# Patient Record
Sex: Female | Born: 1960 | Race: Black or African American | Hispanic: No | Marital: Married | Smoking: Never smoker
Health system: Southern US, Community
[De-identification: ages and names within clinical notes are randomized; demographics above are authoritative.]

## PROBLEM LIST (undated history)

## (undated) DIAGNOSIS — D219 Benign neoplasm of connective and other soft tissue, unspecified: Secondary | ICD-10-CM

## (undated) DIAGNOSIS — J302 Other seasonal allergic rhinitis: Secondary | ICD-10-CM

## (undated) HISTORY — DX: Other seasonal allergic rhinitis: J30.2

## (undated) HISTORY — DX: Benign neoplasm of connective and other soft tissue, unspecified: D21.9

---

## 2008-08-22 ENCOUNTER — Encounter: Admission: RE | Admit: 2008-08-22 | Discharge: 2008-08-22 | Payer: Self-pay | Admitting: Obstetrics

## 2009-12-09 ENCOUNTER — Emergency Department (HOSPITAL_COMMUNITY): Admission: EM | Admit: 2009-12-09 | Discharge: 2009-12-09 | Payer: Self-pay | Admitting: Emergency Medicine

## 2010-09-27 ENCOUNTER — Emergency Department (HOSPITAL_COMMUNITY): Admission: EM | Admit: 2010-09-27 | Discharge: 2010-09-27 | Payer: Self-pay | Admitting: Emergency Medicine

## 2011-02-10 LAB — DIFFERENTIAL
Basophils Absolute: 0.1 10*3/uL (ref 0.0–0.1)
Eosinophils Absolute: 0.5 10*3/uL (ref 0.0–0.7)
Lymphocytes Relative: 18 % (ref 12–46)
Lymphs Abs: 1.1 10*3/uL (ref 0.7–4.0)
Neutrophils Relative %: 68 % (ref 43–77)

## 2011-02-10 LAB — CBC
HCT: 28.5 % — ABNORMAL LOW (ref 36.0–46.0)
Hemoglobin: 9 g/dL — ABNORMAL LOW (ref 12.0–15.0)
MCH: 24 pg — ABNORMAL LOW (ref 26.0–34.0)
MCHC: 31.7 g/dL (ref 30.0–36.0)
MCV: 75.8 fL — ABNORMAL LOW (ref 78.0–100.0)
RBC: 3.76 MIL/uL — ABNORMAL LOW (ref 3.87–5.11)

## 2011-02-10 LAB — GC/CHLAMYDIA PROBE AMP, GENITAL
Chlamydia, DNA Probe: NEGATIVE
GC Probe Amp, Genital: NEGATIVE

## 2011-02-10 LAB — COMPREHENSIVE METABOLIC PANEL
CO2: 25 mEq/L (ref 19–32)
Calcium: 9.4 mg/dL (ref 8.4–10.5)
Chloride: 106 mEq/L (ref 96–112)
Creatinine, Ser: 0.69 mg/dL (ref 0.4–1.2)
GFR calc non Af Amer: >60 mL/min (ref >60)
Glucose, Bld: 107 mg/dL — ABNORMAL HIGH (ref 70–99)
Total Bilirubin: 0.5 mg/dL (ref 0.3–1.2)

## 2011-02-10 LAB — URINALYSIS, ROUTINE W REFLEX MICROSCOPIC
Bilirubin Urine: NEGATIVE
Hgb urine dipstick: NEGATIVE
Ketones, ur: NEGATIVE mg/dL
Nitrite: POSITIVE — AB
pH: 6 (ref 5.0–8.0)

## 2011-02-10 LAB — URINE MICROSCOPIC-ADD ON

## 2011-02-10 LAB — LIPASE, BLOOD: Lipase: 37 U/L (ref 11–59)

## 2011-02-10 LAB — WET PREP, GENITAL: Yeast Wet Prep HPF POC: NONE SEEN

## 2011-02-24 ENCOUNTER — Encounter: Payer: Self-pay | Admitting: Obstetrics & Gynecology

## 2011-10-13 ENCOUNTER — Encounter: Payer: Self-pay | Admitting: Gynecology

## 2011-10-13 ENCOUNTER — Ambulatory Visit (INDEPENDENT_AMBULATORY_CARE_PROVIDER_SITE_OTHER): Payer: Managed Care, Other (non HMO) | Admitting: Gynecology

## 2011-10-13 ENCOUNTER — Other Ambulatory Visit (HOSPITAL_COMMUNITY)
Admission: RE | Admit: 2011-10-13 | Discharge: 2011-10-13 | Disposition: A | Discharge status: Home / Self Care | Payer: Managed Care, Other (non HMO) | Source: Ambulatory Visit | Attending: Gynecology | Admitting: Gynecology

## 2011-10-13 VITALS — BP 126/82 | Ht 63.5 in | Wt 137.0 lb

## 2011-10-13 DIAGNOSIS — E7800 Pure hypercholesterolemia, unspecified: Secondary | ICD-10-CM

## 2011-10-13 DIAGNOSIS — D649 Anemia, unspecified: Secondary | ICD-10-CM

## 2011-10-13 DIAGNOSIS — D259 Leiomyoma of uterus, unspecified: Secondary | ICD-10-CM

## 2011-10-13 DIAGNOSIS — R82998 Other abnormal findings in urine: Secondary | ICD-10-CM

## 2011-10-13 DIAGNOSIS — R739 Hyperglycemia, unspecified: Secondary | ICD-10-CM

## 2011-10-13 DIAGNOSIS — Z01419 Encounter for gynecological examination (general) (routine) without abnormal findings: Secondary | ICD-10-CM

## 2011-10-13 DIAGNOSIS — R7309 Other abnormal glucose: Secondary | ICD-10-CM

## 2011-10-13 DIAGNOSIS — R590 Localized enlarged lymph nodes: Secondary | ICD-10-CM

## 2011-10-13 DIAGNOSIS — R599 Enlarged lymph nodes, unspecified: Secondary | ICD-10-CM

## 2011-10-13 DIAGNOSIS — N924 Excessive bleeding in the premenopausal period: Secondary | ICD-10-CM

## 2011-10-13 DIAGNOSIS — R635 Abnormal weight gain: Secondary | ICD-10-CM

## 2011-10-13 MED ORDER — DICLOXACILLIN SODIUM 250 MG PO CAPS: 500.0000 mg | ORAL_CAPSULE | Freq: Two times a day (BID) | ORAL | Status: AC

## 2011-10-13 NOTE — Progress Notes (Signed)
Andrea Bishop Oct 11, 1961 [id number]   History:    50 y.o.  for annual exam who stated that her last gynecological examination was over 5 years ago. She states her Pap smears in the past been normal. She has been informed the past and she had a fibroid uterus. She is complaining sometimes feeling bloated and pressure and heavy periods for the first 3 days of her cycle and these were total 7-9 days. She's had history of chlamydia x2 in the past and history of HSV-2 as well. She's not using any form of contraception and sexually active. At times complaints of vaginal dryness. She had brought documentation for an emergency room visit approximately one year ago she was treated for urinary tract infection and because of abdominal pressure especially in the pelvic region she had an ultrasound which demonstrated at that point that her uterus measured 13.7 x 7.3 x 7.2 cm with a fundal fibroid measuring 6.0 x 5.5 x 6.3 cm. Also the ER visit there had demonstrated that she had anemia with a hemoglobin of 9.0 and she states that she sometimes takes iron supplementation and other times not. She was also complaining of a nodularity at that she noted on the right axillary region. And Past medical history,surgical history, family history and social history were all reviewed and documented in the EPIC chart.  Gynecologic History Patient's last menstrual period was 09/29/2011. Contraception: none Last Pap: Greater than 5 years ago. Results were: normal Last mammogram: Greater than 5 years ago. Results were: normal  Obstetric History OB History    Grav Para Term Preterm Abortions TAB SAB Ect Mult Living   3 1 1  2     1      # Outc Date GA Lbr Len/2nd Wgt Sex Del Anes PTL Lv   1 TRM     F CS  No Yes   2 ABT            3 ABT                ROS:  Was performed and pertinent positives and negatives are included in the history.  Exam: chaperone present  BP 126/82  Ht 5' 3.5" (1.613 m)  Wt 137 lb (62.143 kg)   BMI 23.89 kg/m2  LMP 09/29/2011  Body mass index is 23.89 kg/(m^2).  General appearance : Well developed well nourished female. No acute distress HEENT: Neck supple, trachea midline, no carotid bruits, no thyroidmegaly Lungs: Clear to auscultation, no rhonchi or wheezes, or rib retractions  Heart: Regular rate and rhythm, no murmurs or gallops Breast:Examined in sitting and supine position were symmetrical in appearance, no palpable masses or tenderness,  no skin retraction, no nipple inversion, no nipple discharge, no skin discoloration, no axillary or supraclavicular lymphadenopathy on the left but on the right there appears to be small nonerythematous inflamed axillary lymph node. Abdomen: no palpable masses or tenderness, no rebound or guarding Extremities: no edema or skin discoloration or tenderness  Pelvic:  Bartholin, Urethra, Skene Glands: Within normal limits             Vagina: No gross lesions or discharge  Cervix: No gross lesions or discharge  Uterus  14 week size, irregular shaped adnexa difficult to examine due to the size of the uterus.  Anus and perineum  normal   Rectovaginal  normal sphincter tone without palpated masses or tenderness             Hemoccult not done  Assessment/Plan:  50 y.o. female for annual exam with symptomatic leiomyomatous uteri. On examination today her uterus felt to be 14 week size. She has also a right reactive lymphadenopathy for which she'll be placed on dicloxacillin 500 mg twice a day for 10 day course. She will schedule her mammogram was is overdue. She'll return back to the office in 2 weeks for her breast exam as well as for ultrasound and to discuss further management. We discussed about consideration of a, hysterectomy with ovarian conservation for which literature formation was provided. We'll be checking her CBC, TSH, fasting lipid profile, random blood sugar, urinalysis along with her Pap smear. She'll return in 2 weeks for  followup and plan a course of management.  Ok Edwards MD, 1:31 PM 10/13/2011

## 2011-10-27 ENCOUNTER — Ambulatory Visit: Payer: Managed Care, Other (non HMO) | Admitting: Gynecology

## 2011-10-27 ENCOUNTER — Other Ambulatory Visit: Payer: Managed Care, Other (non HMO)

## 2011-12-14 ENCOUNTER — Ambulatory Visit: Payer: Managed Care, Other (non HMO) | Admitting: Gynecology

## 2012-01-19 ENCOUNTER — Emergency Department (INDEPENDENT_AMBULATORY_CARE_PROVIDER_SITE_OTHER)
Admission: EM | Admit: 2012-01-19 | Discharge: 2012-01-19 | Disposition: A | Discharge status: Home Health | Payer: Managed Care, Other (non HMO) | Source: Home / Self Care | Attending: Emergency Medicine | Admitting: Emergency Medicine

## 2012-01-19 ENCOUNTER — Encounter (HOSPITAL_COMMUNITY): Payer: Self-pay | Admitting: Emergency Medicine

## 2012-01-19 DIAGNOSIS — R1110 Vomiting, unspecified: Secondary | ICD-10-CM

## 2012-01-19 DIAGNOSIS — R197 Diarrhea, unspecified: Secondary | ICD-10-CM

## 2012-01-19 LAB — CBC
HCT: 36.9 % (ref 36.0–46.0)
Hemoglobin: 12 g/dL (ref 12.0–15.0)
MCHC: 32.5 g/dL (ref 30.0–36.0)
RDW: 14.3 % (ref 11.5–15.5)
WBC: 8.5 10*3/uL (ref 4.0–10.5)

## 2012-01-19 LAB — POCT I-STAT, CHEM 8
Creatinine, Ser: 0.9 mg/dL (ref 0.50–1.10)
Hemoglobin: 13.6 g/dL (ref 12.0–15.0)
Potassium: 4.2 mEq/L (ref 3.5–5.1)
Sodium: 142 mEq/L (ref 135–145)
TCO2: 27 mmol/L (ref 0–100)

## 2012-01-19 LAB — FERRITIN: Ferritin: 24 ng/mL (ref 10–291)

## 2012-01-19 LAB — OCCULT BLOOD, POC DEVICE: Fecal Occult Bld: NEGATIVE

## 2012-01-19 MED ORDER — DIPHENOXYLATE-ATROPINE 2.5-0.025 MG PO TABS: 1.0000 | ORAL_TABLET | Freq: Four times a day (QID) | ORAL | Status: AC | PRN

## 2012-01-19 MED ORDER — ONDANSETRON HCL 4 MG PO TABS: 4.0000 mg | ORAL_TABLET | Freq: Three times a day (TID) | ORAL | Status: AC | PRN

## 2012-01-19 MED ORDER — ONDANSETRON 4 MG PO TBDP
8.0000 mg | ORAL_TABLET | Freq: Once | ORAL | Status: AC
  Administered 2012-01-19: 8 mg via ORAL

## 2012-01-19 MED ORDER — ONDANSETRON 4 MG PO TBDP
ORAL_TABLET | ORAL | Status: AC
  Filled 2012-01-19: qty 1

## 2012-01-19 NOTE — ED Provider Notes (Signed)
History     CSN: [id number]  Arrival date & time 01/19/12  0801   First MD Initiated Contact with Patient 01/19/12 0813      Chief Complaint  Patient presents with  . Emesis    (Consider location/radiation/quality/duration/timing/severity/associated sxs/prior treatment) HPI Comments: Patient presented today to urgent care complaining of sudden onset of vomiting and diarrhea started today after about 3 or 4 episodes she didn't notice that her vomit was straining the water red also complaining of feeling tired.  I elicited all the questions and light after reviewing her previous chart is noticed that she has been diagnosed with anemia and probably gynecological reasons. She has been unable to followup with her gynecologist as discussion about a potential hysterectomy for funduscopic fibroma has been proposed to her.  Patient describes some mild discomfort in her epigastric area, no fevers, no diarrhea does.  Patient is a 51 y.o. female presenting with vomiting. The history is provided by the patient. No language interpreter was used.  Emesis  This is a new problem. The current episode started 12 to 24 hours ago. The problem has been gradually improving. There has been no fever. Associated symptoms include diarrhea. Pertinent negatives include no chills, no cough, no fever, no headaches and no URI.    Past Medical History  Diagnosis Date  . Fibroid   . Seasonal allergies     Past Surgical History  Procedure Date  . Cesarean section     Family History  Problem Relation Age of Onset  . Diabetes Mother   . Hypertension Mother   . Cancer Maternal Grandmother     CIRRHOSIS OF LIVER    History  Substance Use Topics  . Smoking status: Never Smoker   . Smokeless tobacco: Not on file  . Alcohol Use: No    OB History    Grav Para Term Preterm Abortions TAB SAB Ect Mult Living   3 1 1  2     1       Review of Systems  Constitutional: Positive for fatigue. Negative for  fever, chills and diaphoresis.  Respiratory: Negative for cough.   Cardiovascular: Negative for chest pain.  Gastrointestinal: Positive for vomiting and diarrhea.  Neurological: Negative for headaches.  Hematological: Negative for adenopathy.    Allergies  Review of patient's allergies indicates no known allergies.  Home Medications   Current Outpatient Rx  Name Route Sig Dispense Refill  . BEE POLLEN 1000 MG PO TABS Oral Take by mouth.      Marland Kitchen VITAMIN B 12 PO Oral Take by mouth.      Marland Kitchen DIPHENOXYLATE-ATROPINE 2.5-0.025 MG PO TABS Oral Take 1 tablet by mouth 4 (four) times daily as needed for diarrhea or loose stools. 15 tablet 0  . OMEGA-3 FATTY ACIDS 1000 MG PO CAPS Oral Take 2 g by mouth daily.      Marland Kitchen ONDANSETRON HCL 4 MG PO TABS Oral Take 1 tablet (4 mg total) by mouth every 8 (eight) hours as needed for nausea. 20 tablet 0    BP 133/80  Pulse 89  Temp(Src) 97.3 F (36.3 C) (Oral)  Resp 17  SpO2 100%  LMP 01/12/2012  Physical Exam  Nursing note and vitals reviewed. Constitutional: She appears well-nourished. No distress.  HENT:  Head: Normocephalic.  Eyes: Conjunctivae are normal. Right eye exhibits no discharge. Left eye exhibits no discharge.  Neck: Neck supple.  Cardiovascular: Normal rate.   Pulmonary/Chest: Effort normal and breath sounds normal. No respiratory distress.  Abdominal: Soft. Normal appearance. She exhibits no shifting dullness and no distension. There is tenderness in the epigastric area. There is no rebound, no guarding, no CVA tenderness, no tenderness at McBurney's point and negative Murphy's sign.  Lymphadenopathy:    She has no cervical adenopathy.  Skin: Skin is warm.    ED Course  Procedures (including critical care time)  Labs Reviewed  POCT I-STAT, CHEM 8 - Abnormal; Notable for the following:    Glucose, Bld 105 (*)    All other components within normal limits  CBC  FERRITIN  OCCULT BLOOD, POC DEVICE   No results found.   1.  Vomiting   2. Diarrhea       MDM  Patient looks comfortable on exam with sudden onset of gastrointestinal symptoms which included vomiting and diarrhea one of her episodes notice some redness in room sustaining a told water looked clear.        Jimmie Molly, MD 01/19/12 [phone]

## 2012-01-19 NOTE — ED Notes (Signed)
Pt having emesis and diarrhea that started today. She states there was some redness in her emesis that may have been blood. Pt also complain of fatigue recently.

## 2012-01-19 NOTE — Discharge Instructions (Signed)
As discussed during your exam if pain on your lower abdomen worsens or localizes to the right side or the left side of your abdomen you should go to the emergency department for further evaluation. Take prescribed medicines and take dietary precautions as discussed with the next 2 days.    Diarrhea Infections caused by germs (bacterial) or a virus commonly cause diarrhea. Your caregiver has determined that with time, rest and fluids, the diarrhea should improve. In general, eat normally while drinking more water than usual. Although water may prevent dehydration, it does not contain salt and minerals (electrolytes). Broths, weak tea without caffeine and oral rehydration solutions (ORS) replace fluids and electrolytes. Small amounts of fluids should be taken frequently. Large amounts at one time may not be tolerated. Plain water may be harmful in infants and the elderly. Oral rehydrating solutions (ORS) are available at pharmacies and grocery stores. ORS replace water and important electrolytes in proper proportions. Sports drinks are not as effective as ORS and may be harmful due to sugars worsening diarrhea.  ORS is especially recommended for use in children with diarrhea. As a general guideline for children, replace any new fluid losses from diarrhea and/or vomiting with ORS as follows:   If your child weighs 22 pounds or under (10 kg or less), give 60-120 mL ( -  cup or 2 - 4 ounces) of ORS for each episode of diarrheal stool or vomiting episode.   If your child weighs more than 22 pounds (more than 10 kgs), give 120-240 mL ( - 1 cup or 4 - 8 ounces) of ORS for each diarrheal stool or episode of vomiting.   While correcting for dehydration, children should eat normally. However, foods high in sugar should be avoided because this may worsen diarrhea. Large amounts of carbonated soft drinks, juice, gelatin desserts and other highly sugared drinks should be avoided.   After correction of  dehydration, other liquids that are appealing to the child may be added. Children should drink small amounts of fluids frequently and fluids should be increased as tolerated. Children should drink enough fluids to keep urine clear or pale yellow.   Adults should eat normally while drinking more fluids than usual. Drink small amounts of fluids frequently and increase as tolerated. Drink enough fluids to keep urine clear or pale yellow. Broths, weak decaffeinated tea, lemon lime soft drinks (allowed to go flat) and ORS replace fluids and electrolytes.   Avoid:   Carbonated drinks.   Juice.   Extremely hot or cold fluids.   Caffeine drinks.   Fatty, greasy foods.   Alcohol.   Tobacco.   Too much intake of anything at one time.   Gelatin desserts.   Probiotics are active cultures of beneficial bacteria. They may lessen the amount and number of diarrheal stools in adults. Probiotics can be found in yogurt with active cultures and in supplements.   Wash hands well to avoid spreading bacteria and virus.   Anti-diarrheal medications are not recommended for infants and children.   Only take over-the-counter or prescription medicines for pain, discomfort or fever as directed by your caregiver. Do not give aspirin to children because it may cause Reye's Syndrome.   For adults, ask your caregiver if you should continue all prescribed and over-the-counter medicines.   If your caregiver has given you a follow-up appointment, it is very important to keep that appointment. Not keeping the appointment could result in a chronic or permanent injury, and disability. If there is  any problem keeping the appointment, you must call back to this facility for assistance.  SEEK IMMEDIATE MEDICAL CARE IF:   You or your child is unable to keep fluids down or other symptoms or problems become worse in spite of treatment.   Vomiting or diarrhea develops and becomes persistent.   There is vomiting of blood  or bile (green material).   There is blood in the stool or the stools are black and tarry.   There is no urine output in 6-8 hours or there is only a small amount of very dark urine.   Abdominal pain develops, increases or localizes.   You have a fever.   Your baby is older than 3 months with a rectal temperature of 102 F (38.9 C) or higher.   Your baby is 27 months old or younger with a rectal temperature of 100.4 F (38 C) or higher.   You or your child develops excessive weakness, dizziness, fainting or extreme thirst.   You or your child develops a rash, stiff neck, severe headache or become irritable or sleepy and difficult to awaken.  MAKE SURE YOU:   Understand these instructions.   Will watch your condition.   Will get help right away if you are not doing well or get worse.  Document Released: 11/05/2002 Document Revised: 07/28/2011 Document Reviewed: 09/22/2009 [facility] Patient Information 2012 [place], [place].

## 2012-05-28 IMAGING — US US PELVIS COMPLETE MODIFY
1 series · 13 of 25 positions shown · non-contrast
Comparison: None

CLINICAL DATA: Abdominal pain.  Left pelvic pain.  Vaginal
discharge.



[Series 1: us pelvis complete modify · 0.28mm/px · 13 of 47 slices shown]
[im 1/47]
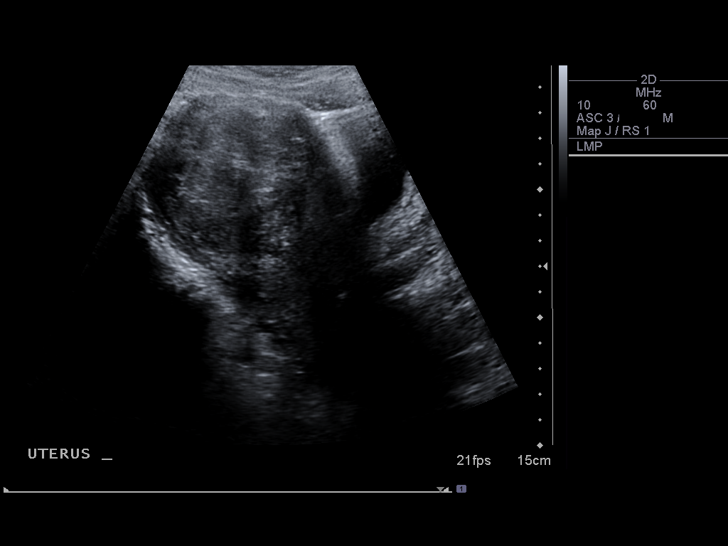
[im 4/47]
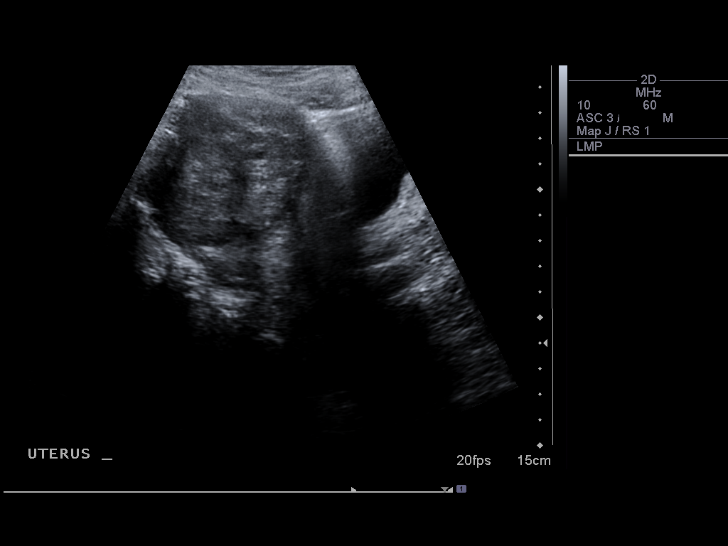
[im 8/47]
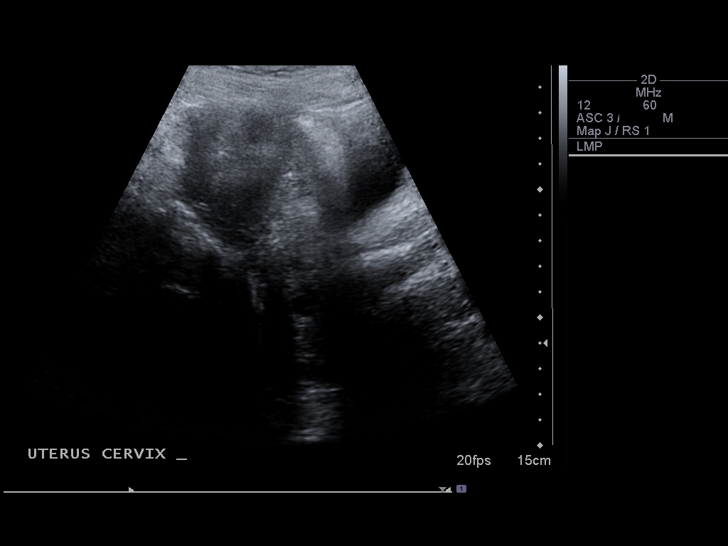
[im 12/47]
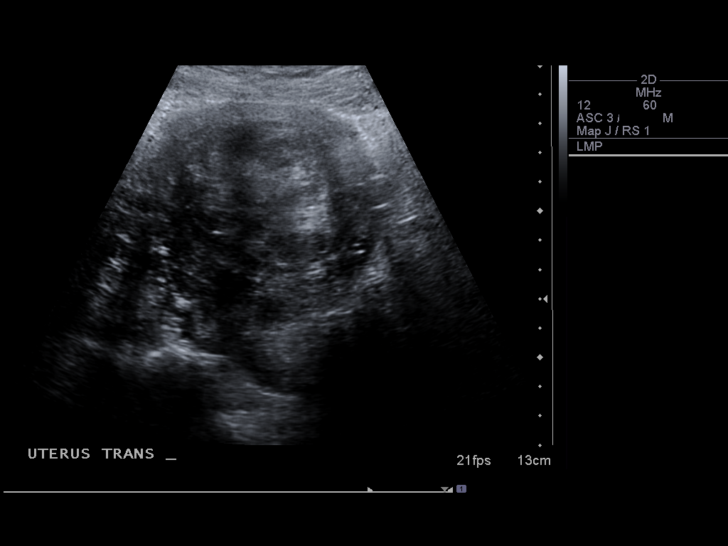
[im 16/47]
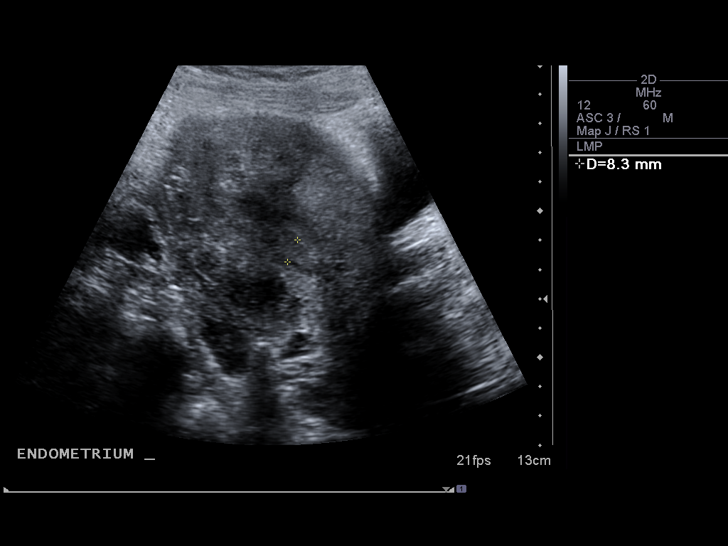
[im 20/47]
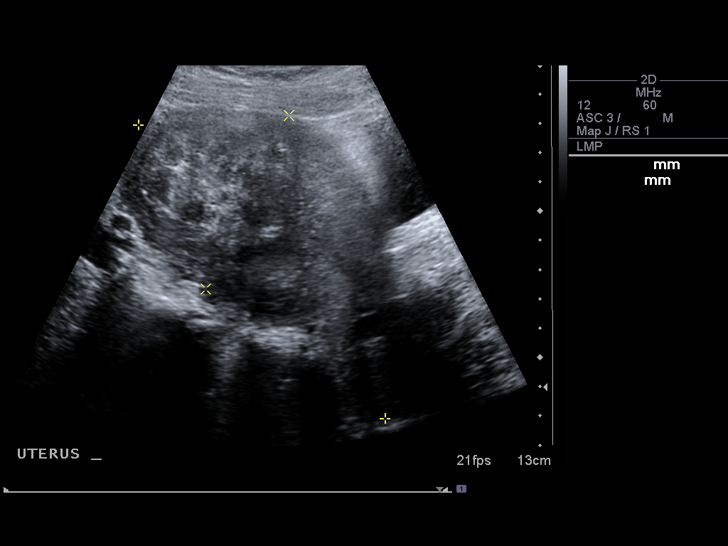
[im 24/47]
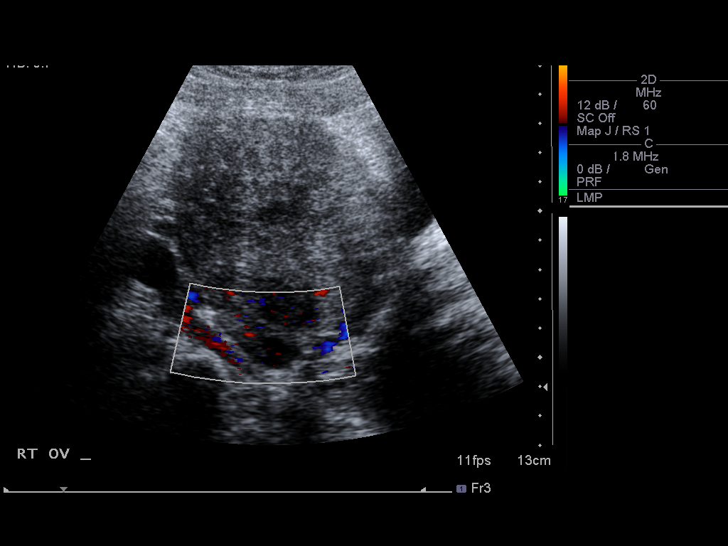
[im 27/47]
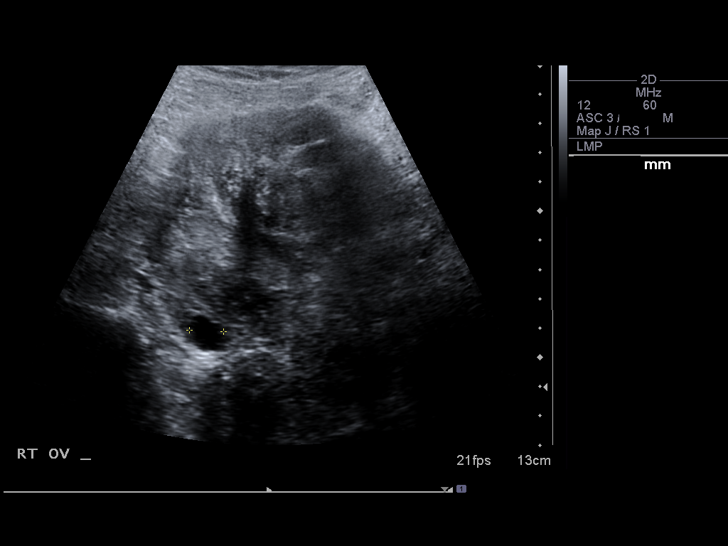
[im 31/47]
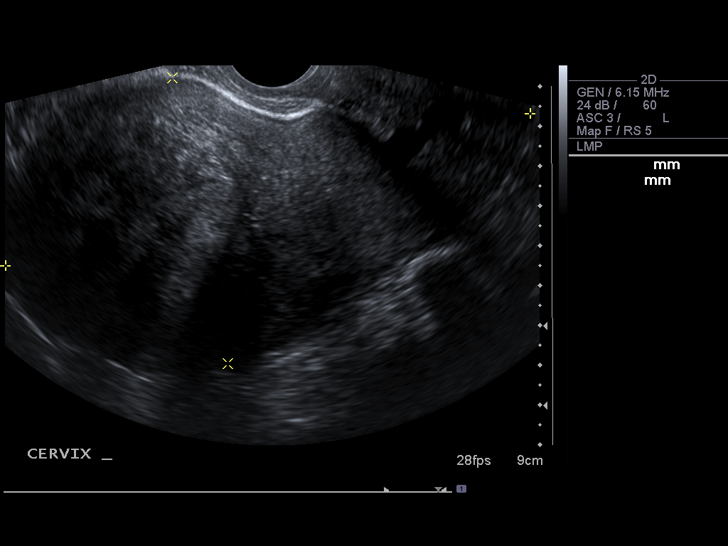
[im 35/47]
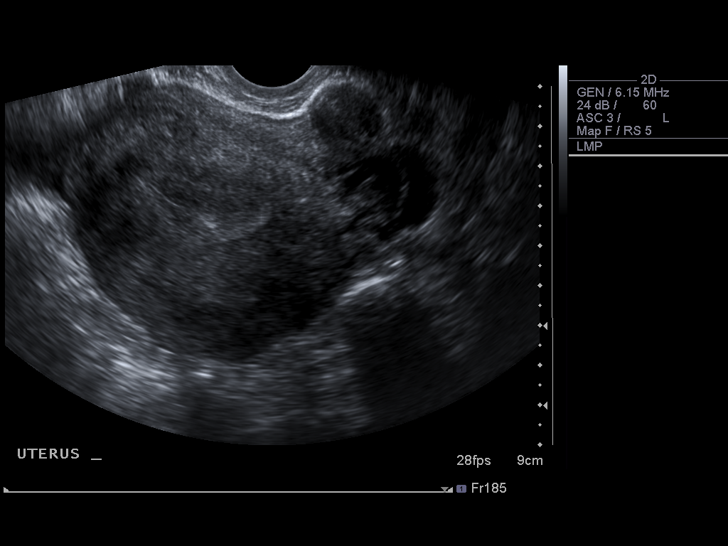
[im 39/47]
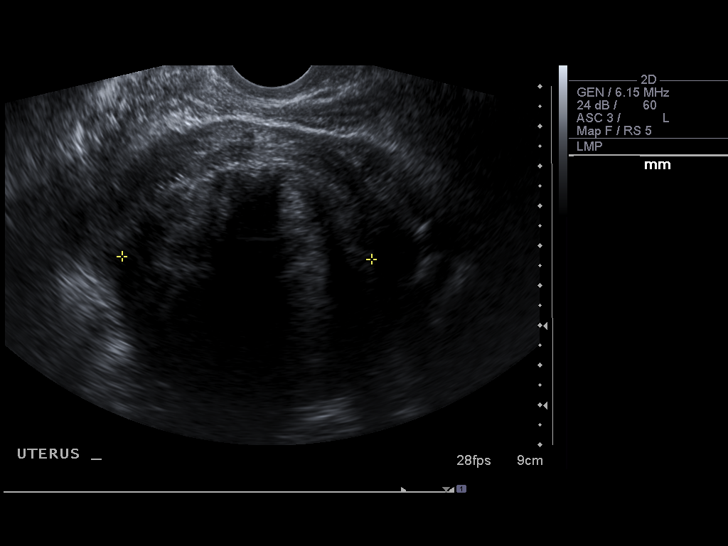
[im 43/47]
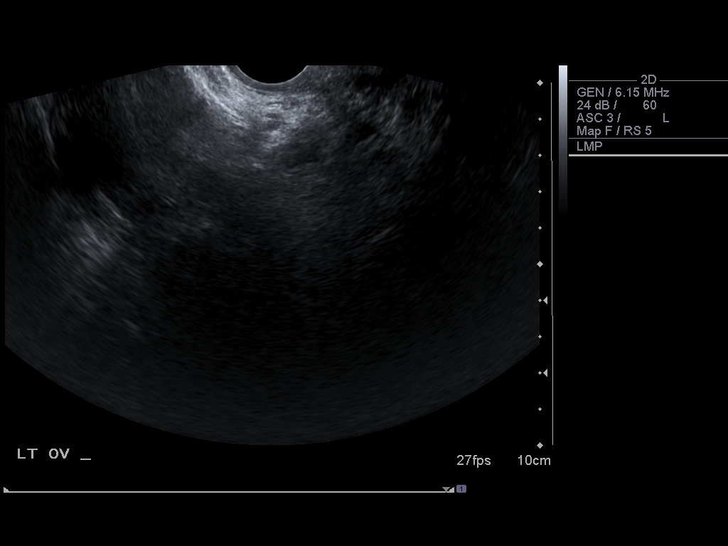
[im 47/47]
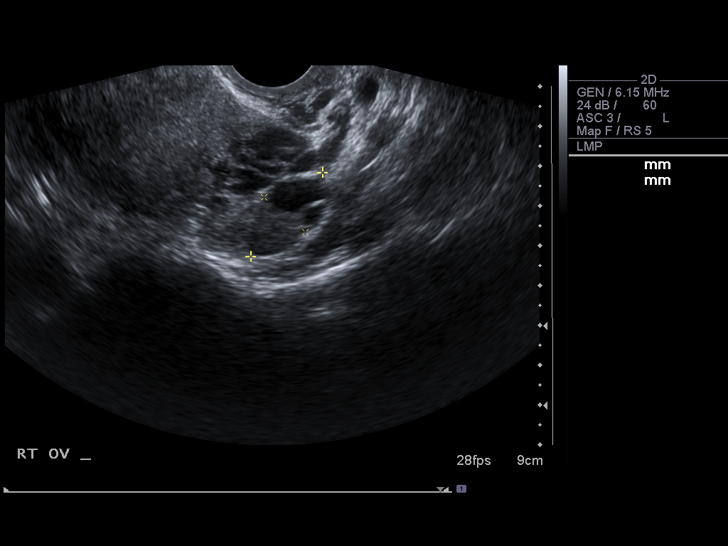

[13 of 25 positions shown; findings below may reference images not displayed]

FINDINGS: Uterus is enlarged measuring 13.7 x 7.3 x 7.2 cm.  There is
leiomyomatous change of the uterus with the largest fibroid being
at the fundus measuring 6.0 x 5.5 x 6.3 cm.

Endometrium is slightly thickened at 8.3 cm.  No endometrial fluid.

Right Ovary measures 3.2 x 1.9 x 2.2 cm.  This contains a 1.5 x
x 1.2 cm functional cyst.  Normal arterial and venous flow.

Left Ovary is not identified using either technique.

Other Findings:  No free fluid
IMPRESSION: Enlargement of the uterus due to leiomyomatous change.  Dominant 6
cm fibroid at the vertex of the fundus.

Unremarkable appearance of the right ovary.  Left ovary is not
identified using either technique.

No free fluid.

## 2013-02-20 ENCOUNTER — Other Ambulatory Visit: Payer: Self-pay | Admitting: Obstetrics and Gynecology

## 2013-02-20 DIAGNOSIS — N6320 Unspecified lump in the left breast, unspecified quadrant: Secondary | ICD-10-CM

## 2013-03-02 ENCOUNTER — Ambulatory Visit
Admission: RE | Admit: 2013-03-02 | Discharge: 2013-03-02 | Disposition: A | Discharge status: Home / Self Care | Payer: Managed Care, Other (non HMO) | Source: Ambulatory Visit | Attending: Obstetrics and Gynecology | Admitting: Obstetrics and Gynecology

## 2013-03-02 DIAGNOSIS — N6320 Unspecified lump in the left breast, unspecified quadrant: Secondary | ICD-10-CM

## 2013-12-24 ENCOUNTER — Emergency Department (HOSPITAL_BASED_OUTPATIENT_CLINIC_OR_DEPARTMENT_OTHER)
Admission: EM | Admit: 2013-12-24 | Discharge: 2013-12-24 | Disposition: A | Discharge status: Home / Self Care | Payer: Managed Care, Other (non HMO) | Attending: Emergency Medicine | Admitting: Emergency Medicine

## 2013-12-24 ENCOUNTER — Encounter (HOSPITAL_BASED_OUTPATIENT_CLINIC_OR_DEPARTMENT_OTHER): Payer: Self-pay | Admitting: Emergency Medicine

## 2013-12-24 DIAGNOSIS — M779 Enthesopathy, unspecified: Secondary | ICD-10-CM

## 2013-12-24 DIAGNOSIS — R252 Cramp and spasm: Secondary | ICD-10-CM | POA: Insufficient documentation

## 2013-12-24 DIAGNOSIS — Z8742 Personal history of other diseases of the female genital tract: Secondary | ICD-10-CM | POA: Insufficient documentation

## 2013-12-24 DIAGNOSIS — Z79899 Other long term (current) drug therapy: Secondary | ICD-10-CM | POA: Insufficient documentation

## 2013-12-24 DIAGNOSIS — Z8709 Personal history of other diseases of the respiratory system: Secondary | ICD-10-CM | POA: Insufficient documentation

## 2013-12-24 DIAGNOSIS — M659 Unspecified synovitis and tenosynovitis, unspecified site: Secondary | ICD-10-CM | POA: Insufficient documentation

## 2013-12-24 DIAGNOSIS — Z791 Long term (current) use of non-steroidal anti-inflammatories (NSAID): Secondary | ICD-10-CM | POA: Insufficient documentation

## 2013-12-24 LAB — BASIC METABOLIC PANEL
BUN: 7 mg/dL (ref 6–23)
CALCIUM: 10.1 mg/dL (ref 8.4–10.5)
CHLORIDE: 102 meq/L (ref 96–112)
CO2: 26 meq/L (ref 19–32)
CREATININE: 0.8 mg/dL (ref 0.50–1.10)
GFR calc Af Amer: >90 mL/min (ref >90)
GFR calc non Af Amer: 85 mL/min — ABNORMAL LOW (ref >90)
Glucose, Bld: 113 mg/dL — ABNORMAL HIGH (ref 70–99)
Potassium: 4.6 mEq/L (ref 3.7–5.3)
Sodium: 140 mEq/L (ref 137–147)

## 2013-12-24 MED ORDER — NAPROXEN 375 MG PO TABS: 375.0000 mg | ORAL_TABLET | Freq: Two times a day (BID) | ORAL | Status: DC

## 2013-12-24 MED ORDER — CYCLOBENZAPRINE HCL 10 MG PO TABS: 10.0000 mg | ORAL_TABLET | Freq: Two times a day (BID) | ORAL | Status: DC | PRN

## 2013-12-24 MED ORDER — OXYCODONE-ACETAMINOPHEN 5-325 MG PO TABS
2.0000 | ORAL_TABLET | Freq: Once | ORAL | Status: AC
  Administered 2013-12-24: 2 via ORAL
  Filled 2013-12-24: qty 2

## 2013-12-24 NOTE — Discharge Instructions (Signed)
Muscle Cramps and Spasms Muscle cramps and spasms occur when a muscle or muscles tighten and you have no control over this tightening (involuntary muscle contraction). They are a common problem and can develop in any muscle. The most common place is in the calf muscles of the leg. Both muscle cramps and muscle spasms are involuntary muscle contractions, but they also have differences:   Muscle cramps are sporadic and painful. They may last a few seconds to a quarter of an hour. Muscle cramps are often more forceful and last longer than muscle spasms.  Muscle spasms may or may not be painful. They may also last just a few seconds or much longer. CAUSES  It is uncommon for cramps or spasms to be due to a serious underlying problem. In many cases, the cause of cramps or spasms is unknown. Some common causes are:   Overexertion.   Overuse from repetitive motions (doing the same thing over and over).   Remaining in a certain position for a long period of time.   Improper preparation, form, or technique while performing a sport or activity.   Dehydration.   Injury.   Side effects of some medicines.   Abnormally low levels of the salts and ions in your blood (electrolytes), especially potassium and calcium. This could happen if you are taking water pills (diuretics) or you are pregnant.  Some underlying medical problems can make it more likely to develop cramps or spasms. These include, but are not limited to:   Diabetes.   Parkinson disease.   Hormone disorders, such as thyroid problems.   Alcohol abuse.   Diseases specific to muscles, joints, and bones.   Blood vessel disease where not enough blood is getting to the muscles.  HOME CARE INSTRUCTIONS   Stay well hydrated. Drink enough water and fluids to keep your urine clear or pale yellow.  It may be helpful to massage, stretch, and relax the affected muscle.  For tight or tense muscles, use a warm towel, heating  pad, or hot shower water directed to the affected area.  If you are sore or have pain after a cramp or spasm, applying ice to the affected area may relieve discomfort.  Put ice in a plastic bag.  Place a towel between your skin and the bag.  Leave the ice on for 15-20 minutes, 03-04 times a day.  Medicines used to treat a known cause of cramps or spasms may help reduce their frequency or severity. Only take over-the-counter or prescription medicines as directed by your caregiver. SEEK MEDICAL CARE IF:  Your cramps or spasms get more severe, more frequent, or do not improve over time.  MAKE SURE YOU:   Understand these instructions.  Will watch your condition.  Will get help right away if you are not doing well or get worse. Document Released: 05/07/2002 Document Revised: 03/12/2013 Document Reviewed: 11/01/2012 [facility] Patient Information 2014 [place], [place].  Tendinitis Tendinitis is swelling and inflammation of the tendons. Tendons are band-like tissues that connect muscle to bone. Tendinitis commonly occurs in the:   Shoulders (rotator cuff).  Heels (Achilles tendon).  Elbows (triceps tendon). CAUSES Tendinitis is usually caused by overusing the tendon, muscles, and joints involved. When the tissue surrounding a tendon (synovium) becomes inflamed, it is called tenosynovitis. Tendinitis commonly develops in people whose jobs require repetitive motions. SYMPTOMS  Pain.  Tenderness.  Mild swelling. DIAGNOSIS Tendinitis is usually diagnosed by physical exam. Your caregiver may also order X-rays or other imaging tests.  TREATMENT Your caregiver may recommend certain medicines or exercises for your treatment. HOME CARE INSTRUCTIONS   Use a sling or splint for as long as directed by your caregiver until the pain decreases.  Put ice on the injured area.  Put ice in a plastic bag.  Place a towel between your skin and the bag.  Leave the ice on for 15-20 minutes, 03-04  times a day.  Avoid using the limb while the tendon is painful. Perform gentle range of motion exercises only as directed by your caregiver. Stop exercises if pain or discomfort increase, unless directed otherwise by your caregiver.  Only take over-the-counter or prescription medicines for pain, discomfort, or fever as directed by your caregiver. SEEK MEDICAL CARE IF:   Your pain and swelling increase.  You develop new, unexplained symptoms, especially increased numbness in the hands. MAKE SURE YOU:   Understand these instructions.  Will watch your condition.  Will get help right away if you are not doing well or get worse. Document Released: 11/12/2000 Document Revised: 02/07/2012 Document Reviewed: 02/01/2011 [facility] Patient Information 2014 [place], [place].

## 2013-12-24 NOTE — ED Provider Notes (Signed)
CSN: [id number]     Arrival date & time 12/24/13  2585 History   First MD Initiated Contact with Patient 12/24/13 [phone]     Chief Complaint  Patient presents with  . Leg Pain   (Consider location/radiation/quality/duration/timing/severity/associated sxs/prior Treatment) HPI Comments: Patient reports intermittent cramping to her left thigh that started last night. She had one episode of week ago but it wasn't as severe and it went away on its own. She states the cramping last night was severe and kept her awake all night. She denies any back pain. She denies any radiation into her lower leg. She denies any numbness or weakness in the leg. The crampiness to her anterior left thigh. She denies any known injuries or overuse. She was working out at the gym doing leg lifts 2 days ago but states the episode week ago his prior to that. She also reports a month history of some pain in her right upper arm. It's worse with abduction of the arm. She denies any weakness of the room other than sometimes, she has to lift it with her other hand due to the pain. She denies any numbness in her hand. 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  Patient is a 53 y.o. female presenting with leg pain.  Leg Pain Associated symptoms: no back pain, no fever and no neck pain     Past Medical History  Diagnosis Date  . Fibroid   . Seasonal allergies    Past Surgical History  Procedure Laterality Date  . Cesarean section     Family History  Problem Relation Age of Onset  . Diabetes Mother   . Hypertension Mother   . Cancer Maternal Grandmother     CIRRHOSIS OF LIVER   History  Substance Use Topics  . Smoking status: Never Smoker   . Smokeless tobacco: Not on file  . Alcohol Use: No   OB History   Grav Para Term Preterm Abortions TAB SAB Ect Mult Living   3 1 1  2     1      Review of Systems  Constitutional: Negative for fever.  Gastrointestinal: Negative for nausea  and vomiting.  Musculoskeletal: Positive for arthralgias and myalgias. Negative for back pain, joint swelling and neck pain.  Skin: Negative for wound.  Neurological: Negative for weakness, numbness and headaches.    Allergies  Review of patient's allergies indicates no known allergies.  Home Medications   Current Outpatient Rx  Name  Route  Sig  Dispense  Refill  . Bee Pollen 1000 MG TABS   Oral   Take by mouth.           . Cyanocobalamin (VITAMIN B 12 PO)   Oral   Take by mouth.           . cyclobenzaprine (FLEXERIL) 10 MG tablet   Oral   Take 1 tablet (10 mg total) by mouth 2 (two) times daily as needed for muscle spasms.   20 tablet   0   . fish oil-omega-3 fatty acids 1000 MG capsule   Oral   Take 2 g by mouth daily.           . naproxen (NAPROSYN) 375 MG tablet   Oral   Take 1 tablet (375 mg total) by mouth 2 (two) times daily.   20 tablet   0    BP 132/81  Pulse 85  Temp(Src) 98.2 F (36.8 C) (Oral)  Resp 18  Ht 5\' 6"  (1.676 m)  Wt 145 lb (65.772 kg)  BMI 23.41 kg/m2  SpO2 100%  LMP 12/01/2013 Physical Exam  Constitutional: She is oriented to person, place, and time. She appears well-developed and well-nourished.  HENT:  Head: Normocephalic and atraumatic.  Neck: Normal range of motion. Neck supple.  Cardiovascular: Normal rate.   Pulmonary/Chest: Effort normal.  Musculoskeletal: She exhibits tenderness.  There is some tenderness over the quadriceps muscle on the left. There is no bony tenderness. There is no pain to the hip or the knee. There is no swelling of her leg. She has normal sensation in the foot. Normal pulses in the foot. Normal motor function in the leg. There is some tenderness along the triceps muscle in the right arm as well as the triceps tendon insertion. There is no bony tenderness to the shoulder or the elbow. She has normal sensation and motor function in the arm. Radial pulses are 2+ bilaterally.  Neurological: She is alert  and oriented to person, place, and time.  Skin: Skin is warm and dry.  Psychiatric: She has a normal mood and affect.    ED Course  Procedures (including critical care time) Labs Review Labs Reviewed  BASIC METABOLIC PANEL - Abnormal; Notable for the following:    Glucose, Bld 113 (*)    GFR calc non Af Amer 85 (*)    All other components within normal limits   Imaging Review No results found.  EKG Interpretation   None       MDM   1. Muscle cramp   2. Tendonitis    Patient presents with a one-day history of muscle cramps in her thigh. There's no posterior pain swelling or other suggestions of a DVT. There is no bony tenderness that would warrant x-rays at this point. There is new suggestions of neuropathy or radiation from her back. She also has tenderness in her right arm which is more suggestive of tendinitis. She was discharged home in good condition. She was given a prescription for Naprosyn and Flexeril. She was encouraged to follow with Dr. Barbaraann Barthel.    Malvin Johns, MD 12/24/13 [phone]

## 2013-12-24 NOTE — ED Notes (Signed)
Pt complains of cramping in left thigh that wont stop. Pt reports having episode about one week ago but last night was severe.  Pt also complains of right arm pain. Pt complains of not being able to pick up things with right arm but it is sore.

## 2014-04-24 ENCOUNTER — Other Ambulatory Visit: Payer: Self-pay | Admitting: Obstetrics and Gynecology

## 2014-04-24 DIAGNOSIS — N63 Unspecified lump in unspecified breast: Secondary | ICD-10-CM

## 2014-09-30 ENCOUNTER — Encounter (HOSPITAL_BASED_OUTPATIENT_CLINIC_OR_DEPARTMENT_OTHER): Payer: Self-pay | Admitting: Emergency Medicine

## 2015-07-18 ENCOUNTER — Other Ambulatory Visit: Payer: Self-pay

## 2015-07-18 ENCOUNTER — Other Ambulatory Visit (HOSPITAL_COMMUNITY)
Admission: RE | Admit: 2015-07-18 | Discharge: 2015-07-18 | Disposition: A | Discharge status: Home / Self Care | Payer: BLUE CROSS/BLUE SHIELD | Source: Ambulatory Visit | Attending: Family Medicine | Admitting: Family Medicine

## 2015-07-18 DIAGNOSIS — Z01419 Encounter for gynecological examination (general) (routine) without abnormal findings: Secondary | ICD-10-CM | POA: Diagnosis present

## 2015-07-22 LAB — CYTOLOGY - PAP

## 2015-11-26 ENCOUNTER — Other Ambulatory Visit: Payer: Self-pay | Admitting: Physician Assistant

## 2015-11-26 ENCOUNTER — Ambulatory Visit
Admission: RE | Admit: 2015-11-26 | Discharge: 2015-11-26 | Disposition: A | Discharge status: Home / Self Care | Payer: BLUE CROSS/BLUE SHIELD | Source: Ambulatory Visit | Attending: Physician Assistant | Admitting: Physician Assistant

## 2015-11-26 DIAGNOSIS — R1012 Left upper quadrant pain: Secondary | ICD-10-CM

## 2016-06-27 ENCOUNTER — Encounter (HOSPITAL_BASED_OUTPATIENT_CLINIC_OR_DEPARTMENT_OTHER): Payer: Self-pay | Admitting: *Deleted

## 2016-06-27 ENCOUNTER — Emergency Department (HOSPITAL_BASED_OUTPATIENT_CLINIC_OR_DEPARTMENT_OTHER)
Admission: EM | Admit: 2016-06-27 | Discharge: 2016-06-27 | Disposition: A | Discharge status: Home / Self Care | Payer: BLUE CROSS/BLUE SHIELD | Attending: Emergency Medicine | Admitting: Emergency Medicine

## 2016-06-27 ENCOUNTER — Emergency Department (HOSPITAL_BASED_OUTPATIENT_CLINIC_OR_DEPARTMENT_OTHER): Payer: BLUE CROSS/BLUE SHIELD

## 2016-06-27 DIAGNOSIS — R072 Precordial pain: Secondary | ICD-10-CM | POA: Insufficient documentation

## 2016-06-27 DIAGNOSIS — R079 Chest pain, unspecified: Secondary | ICD-10-CM

## 2016-06-27 LAB — BASIC METABOLIC PANEL
ANION GAP: 6 (ref 5–15)
BUN: 10 mg/dL (ref 6–20)
CALCIUM: 9.7 mg/dL (ref 8.9–10.3)
CHLORIDE: 103 mmol/L (ref 101–111)
CO2: 28 mmol/L (ref 22–32)
CREATININE: 0.74 mg/dL (ref 0.44–1.00)
GFR calc non Af Amer: >60 mL/min (ref >60)
Glucose, Bld: 87 mg/dL (ref 65–99)
Potassium: 4 mmol/L (ref 3.5–5.1)
SODIUM: 137 mmol/L (ref 135–145)

## 2016-06-27 LAB — CBC
HCT: 41.2 % (ref 36.0–46.0)
HEMOGLOBIN: 13.5 g/dL (ref 12.0–15.0)
MCH: 29.6 pg (ref 26.0–34.0)
MCHC: 32.8 g/dL (ref 30.0–36.0)
MCV: 90.4 fL (ref 78.0–100.0)
Platelets: 227 10*3/uL (ref 150–400)
RBC: 4.56 MIL/uL (ref 3.87–5.11)
RDW: 13.1 % (ref 11.5–15.5)
WBC: 6.9 10*3/uL (ref 4.0–10.5)

## 2016-06-27 LAB — TROPONIN I

## 2016-06-27 NOTE — ED Notes (Addendum)
PA in room with PT now. I asked pt to take her top off when he leaves so that I can come back after he is finished and place her on the monitor and obtain vital signs. Pt did not respond. Has arms crossed and seems agitated.

## 2016-06-27 NOTE — ED Triage Notes (Signed)
Pt reports intermittent substernal CP x 3 days.  Denies N/V/SOB.  Nonradiating.  nontender on palpation.  Denies increased pain with deep inspiration.

## 2016-06-27 NOTE — ED Notes (Signed)
Josh PA at bedside   

## 2016-06-27 NOTE — ED Provider Notes (Signed)
[facility] Provider Note   CSN: [id number] Arrival date & time: 06/27/16  1510  First Provider Contact:  First MD Initiated Contact with Patient 06/27/16 1654    By signing my name below, I, Andrea Bishop, attest that this documentation has been prepared under the direction and in the presence of [employer], PA-C. Electronically Signed: Randa Bishop, ED Scribe. 06/27/16. 5:02 PM.     History   Chief Complaint Chief Complaint  Patient presents with  . Chest Pain    HPI Andrea Bishop is a 55 y.o. female.  The history is provided by the patient. No language interpreter was used.  Chest Pain   Pertinent negatives include no abdominal pain, no back pain, no cough, no diaphoresis, no fever, no nausea, no palpitations, no shortness of breath and no vomiting.   HPI Comments: Andrea Bishop is a 55 y.o. female who presents to the Emergency Department complaining of sudden intermittent central CP onset 3 days prior. She describes the pain as a pressure on her chest that last for about 1 minute. She states that the pain is non radiating. She states that that the pain is worse in the morning and occurs every 15 - 20 minutes. Pt does report that she has started a new job where she does a lot of heavy lifting. Pt reports that about 1 month prior she did have intermittent sharp chest pains that resolved on its own. States she has tried prune juice with no relief. Denies SOB, leg swelling, diaphoresis, nausea, vomiting or palpitations. Denies DVT or PE's. Denies recent long travel or surgery. Denies hormone use. Denies tobacco use. Pt reports HX of family early CAD in her father.   Past Medical History:  Diagnosis Date  . Fibroid   . Seasonal allergies     Patient Active Problem List   Diagnosis Date Noted  . Fibroid uterus 10/13/2011    Past Surgical History:  Procedure Laterality Date  . CESAREAN SECTION      OB History    Gravida Para Term Preterm AB Living   3 1 1   2 1    SAB TAB Ectopic Multiple Live Births                   Home Medications    Prior to Admission medications   Not on File    Family History Family History  Problem Relation Age of Onset  . Diabetes Mother   . Hypertension Mother   . Cancer Maternal Grandmother     CIRRHOSIS OF LIVER    Social History Social History  Substance Use Topics  . Smoking status: Never Smoker  . Smokeless tobacco: Never Used  . Alcohol use No     Allergies   Aleve [naproxen]   Review of Systems Review of Systems  Constitutional: Negative for diaphoresis and fever.  Eyes: Negative for redness.  Respiratory: Negative for cough and shortness of breath.   Cardiovascular: Positive for chest pain. Negative for palpitations and leg swelling.  Gastrointestinal: Negative for abdominal pain, nausea and vomiting.  Genitourinary: Negative for dysuria.  Musculoskeletal: Negative for back pain and neck pain.  Skin: Negative for rash.  Neurological: Negative for syncope and light-headedness.  Psychiatric/Behavioral: The patient is not nervous/anxious.      Physical Exam Updated Vital Signs BP 128/68 (BP Location: Left Arm)   Pulse 82   Temp 97.7 F (36.5 C) (Oral)   Resp 18   Ht 5' 5.5" (1.664 m)  Wt 145 lb (65.8 kg)   LMP 06/27/2016   SpO2 100%   BMI 23.76 kg/m   Physical Exam  Constitutional: She is oriented to person, place, and time. She appears well-developed and well-nourished. No distress.  HENT:  Head: Normocephalic and atraumatic.  Mouth/Throat: Mucous membranes are normal. Mucous membranes are not dry.  Eyes: Conjunctivae and EOM are normal.  Neck: Trachea normal and normal range of motion. Neck supple. Normal carotid pulses and no JVD present. No muscular tenderness present. Carotid bruit is not present. No tracheal deviation present.  Cardiovascular: Normal rate, regular rhythm, S1 normal, S2 normal, normal heart sounds and intact distal pulses.  Exam reveals  no decreased pulses.   No murmur heard. Pulmonary/Chest: Effort normal. No respiratory distress. She has no wheezes. She exhibits no tenderness.  Abdominal: Soft. Normal aorta and bowel sounds are normal. There is no tenderness. There is no rebound and no guarding.  Musculoskeletal: Normal range of motion.  Neurological: She is alert and oriented to person, place, and time.  Skin: Skin is warm and dry. She is not diaphoretic. No cyanosis. No pallor.  Psychiatric: She has a normal mood and affect. Her behavior is normal.  Nursing note and vitals reviewed.    ED Treatments / Results  DIAGNOSTIC STUDIES: Oxygen Saturation is 100% on RA, normal by my interpretation.    COORDINATION OF CARE: 5:03 PM-Discussed treatment plan which includes cardiac work up  CBC panel, BMP and troponin) with pt at bedside and pt agreed to plan.    Labs (all labs ordered are listed, but only abnormal results are displayed) Labs Reviewed  CBC  BASIC METABOLIC PANEL  TROPONIN I    EKG  EKG Interpretation  Date/Time:  Sunday June 27 2016 15:15:31 EDT Ventricular Rate:  79 PR Interval:  158 QRS Duration: 74 QT Interval:  358 QTC Calculation: 410 R Axis:   34 Text Interpretation:  Normal sinus rhythm Normal ECG Confirmed by HAVILAND MD, JULIE ([id number]) on 06/27/2016 3:27:06 PM       Radiology Dg Chest 2 View  Result Date: 06/27/2016 CLINICAL DATA:  Central chest pain for 3 days, initial encounter EXAM: CHEST  2 VIEW COMPARISON:  None. FINDINGS: The heart size and mediastinal contours are within normal limits. Both lungs are clear. The visualized skeletal structures are unremarkable. IMPRESSION: No active cardiopulmonary disease. Electronically Signed   By: Inez Catalina M.D.   On: 06/27/2016 17:54   Procedures Procedures (including critical care time)  Medications Ordered in ED Medications - No data to display   Initial Impression / Assessment and Plan / ED Course  I have reviewed the triage  vital signs and the nursing notes.  Pertinent labs & imaging results that were available during my care of the patient were reviewed by me and considered in my medical decision making (see chart for details).  Clinical Course   6:08 PM Discussed labs with patient. HEART=2. She states that she wants to go home. Discussed that there is low probability of symptoms Being caused by her heart, but we can never say 100%. Encouraged NSAIDs, heating pad for possible muscle spasms. Encouraged avoidance of certain foods which may make symptoms worse.  Patient was counseled to return with severe chest pain, especially if the pain is crushing or pressure-like and spreads to the arms, back, neck, or jaw, or if they have sweating, nausea, or shortness of breath with the pain. They were encouraged to call 911 with these symptoms.   They  were also told to return if their chest pain gets worse and does not go away with rest, they have an attack of chest pain lasting longer than usual despite rest and treatment with the medications their caregiver has prescribed, if they wake from sleep with chest pain or shortness of breath, if they feel dizzy or faint, if they have chest pain not typical of their usual pain, or if they have any other emergent concerns regarding their health.  The patient verbalized understanding and agreed.      Final Clinical Impressions(s) / ED Diagnoses   Final diagnoses:  Nonspecific chest pain   Patient with Atypical chest pain, lasting several seconds before resolving over the past 3 days. Cardiac workup is negative.. Feel patient is low risk for ACS given history (poor story for ACS/MI), negative troponin(s), normal/unchanged EKG. Heart score equals 2. Low suspicion for PE. Chest x-ray is negative.   New Prescriptions New Prescriptions   No medications on file     I personally performed the services described in this documentation, which was scribed in my presence. The recorded  information has been reviewed and is accurate.     Carlisle Cater, PA-C 06/27/16 1815    Isla Pence, MD 06/27/16 2134

## 2016-06-27 NOTE — ED Notes (Addendum)
Patient has been irritated about the wait time while in waiting room. As well as being brought to room. Patient has been informed about the wait time and was instructed to get changed into a gown. Patient puts gown on over top of clothes even though was asked to change out of clothes into gown.

## 2016-06-27 NOTE — Discharge Instructions (Signed)
Please read and follow all provided instructions.  Your diagnoses today include:  1. Nonspecific chest pain     Tests performed today include: An EKG of your heart A chest x-ray Cardiac enzymes - a blood test for heart muscle damage Blood counts and electrolytes Vital signs. See below for your results today.   Medications prescribed:  None  Take any prescribed medications only as directed.  Follow-up instructions: Please follow-up with your primary care provider as soon as you can for further evaluation of your symptoms.   Return instructions:  SEEK IMMEDIATE MEDICAL ATTENTION IF: You have severe chest pain, especially if the pain is crushing or pressure-like and spreads to the arms, back, neck, or jaw, or if you have sweating, nausea (feeling sick to your stomach), or shortness of breath. THIS IS AN EMERGENCY. Don't wait to see if the pain will go away. Get medical help at once. Call 911 or 0 (operator). DO NOT drive yourself to the hospital.  Your chest pain gets worse and does not go away with rest.  You have an attack of chest pain lasting longer than usual, despite rest and treatment with the medications your caregiver has prescribed.  You wake from sleep with chest pain or shortness of breath. You feel dizzy or faint. You have chest pain not typical of your usual pain for which you originally saw your caregiver.  You have any other emergent concerns regarding your health.  Additional Information: Chest pain comes from many different causes. Your caregiver has diagnosed you as having chest pain that is not specific for one problem, but does not require admission.  You are at low risk for an acute heart condition or other serious illness.   Your vital signs today were: BP 132/72 (BP Location: Right Arm)    Pulse 74    Temp 97.7 F (36.5 C) (Oral)    Resp 18    Ht 5' 5.5" (1.664 m)    Wt 65.8 kg    LMP 06/27/2016    SpO2 100%    BMI 23.76 kg/m  If your blood pressure (BP) was  elevated above 135/85 this visit, please have this repeated by your doctor within one month. --------------

## 2016-11-01 ENCOUNTER — Ambulatory Visit: Payer: Self-pay | Admitting: Allergy and Immunology

## 2018-02-26 IMAGING — CR DG CHEST 2V
2 series · 2 of 2 positions shown · non-contrast
Comparison: None.

CLINICAL DATA: Central chest pain for 3 days, initial encounter

EXAM:
CHEST  2 VIEW

[w chest pa]
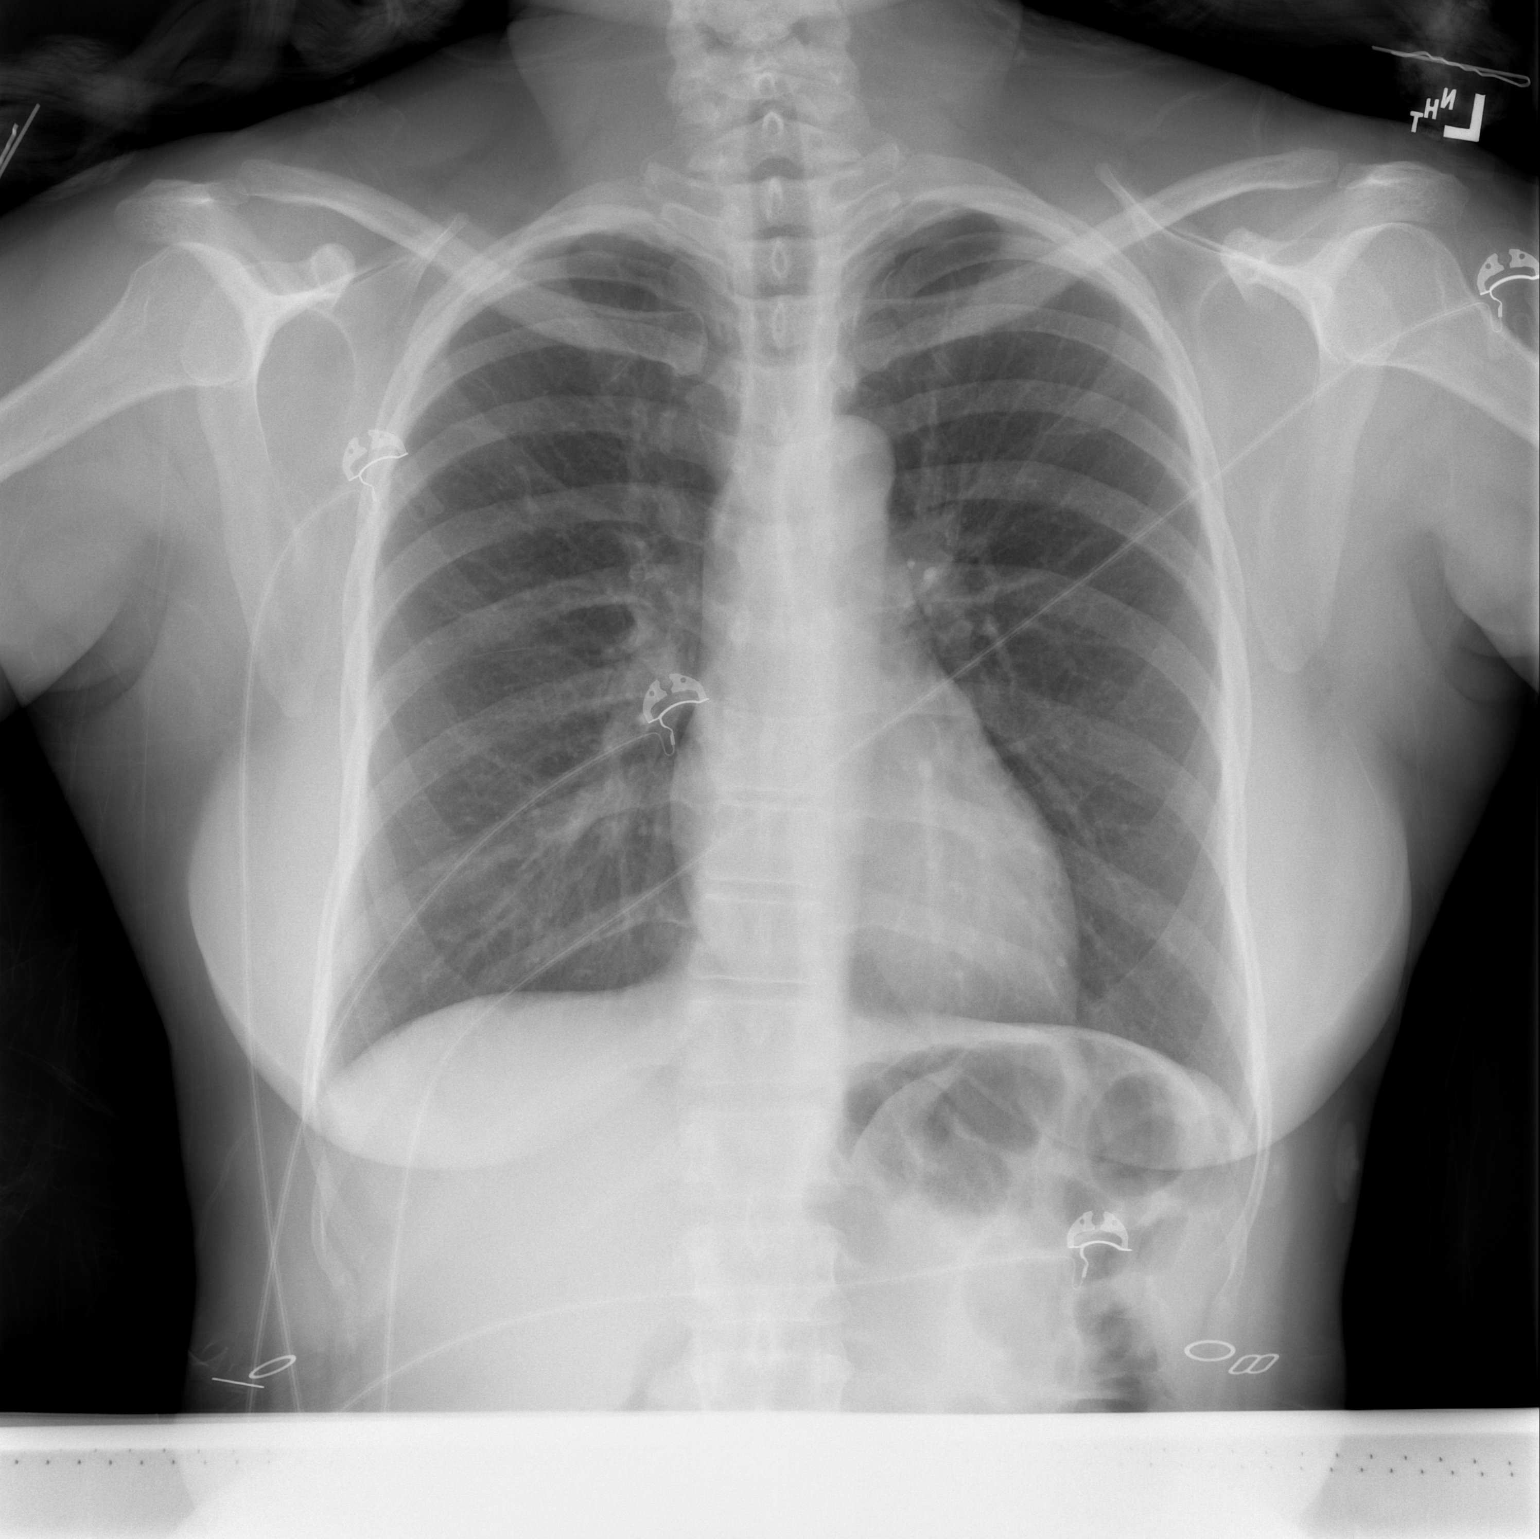

[w chest lat]
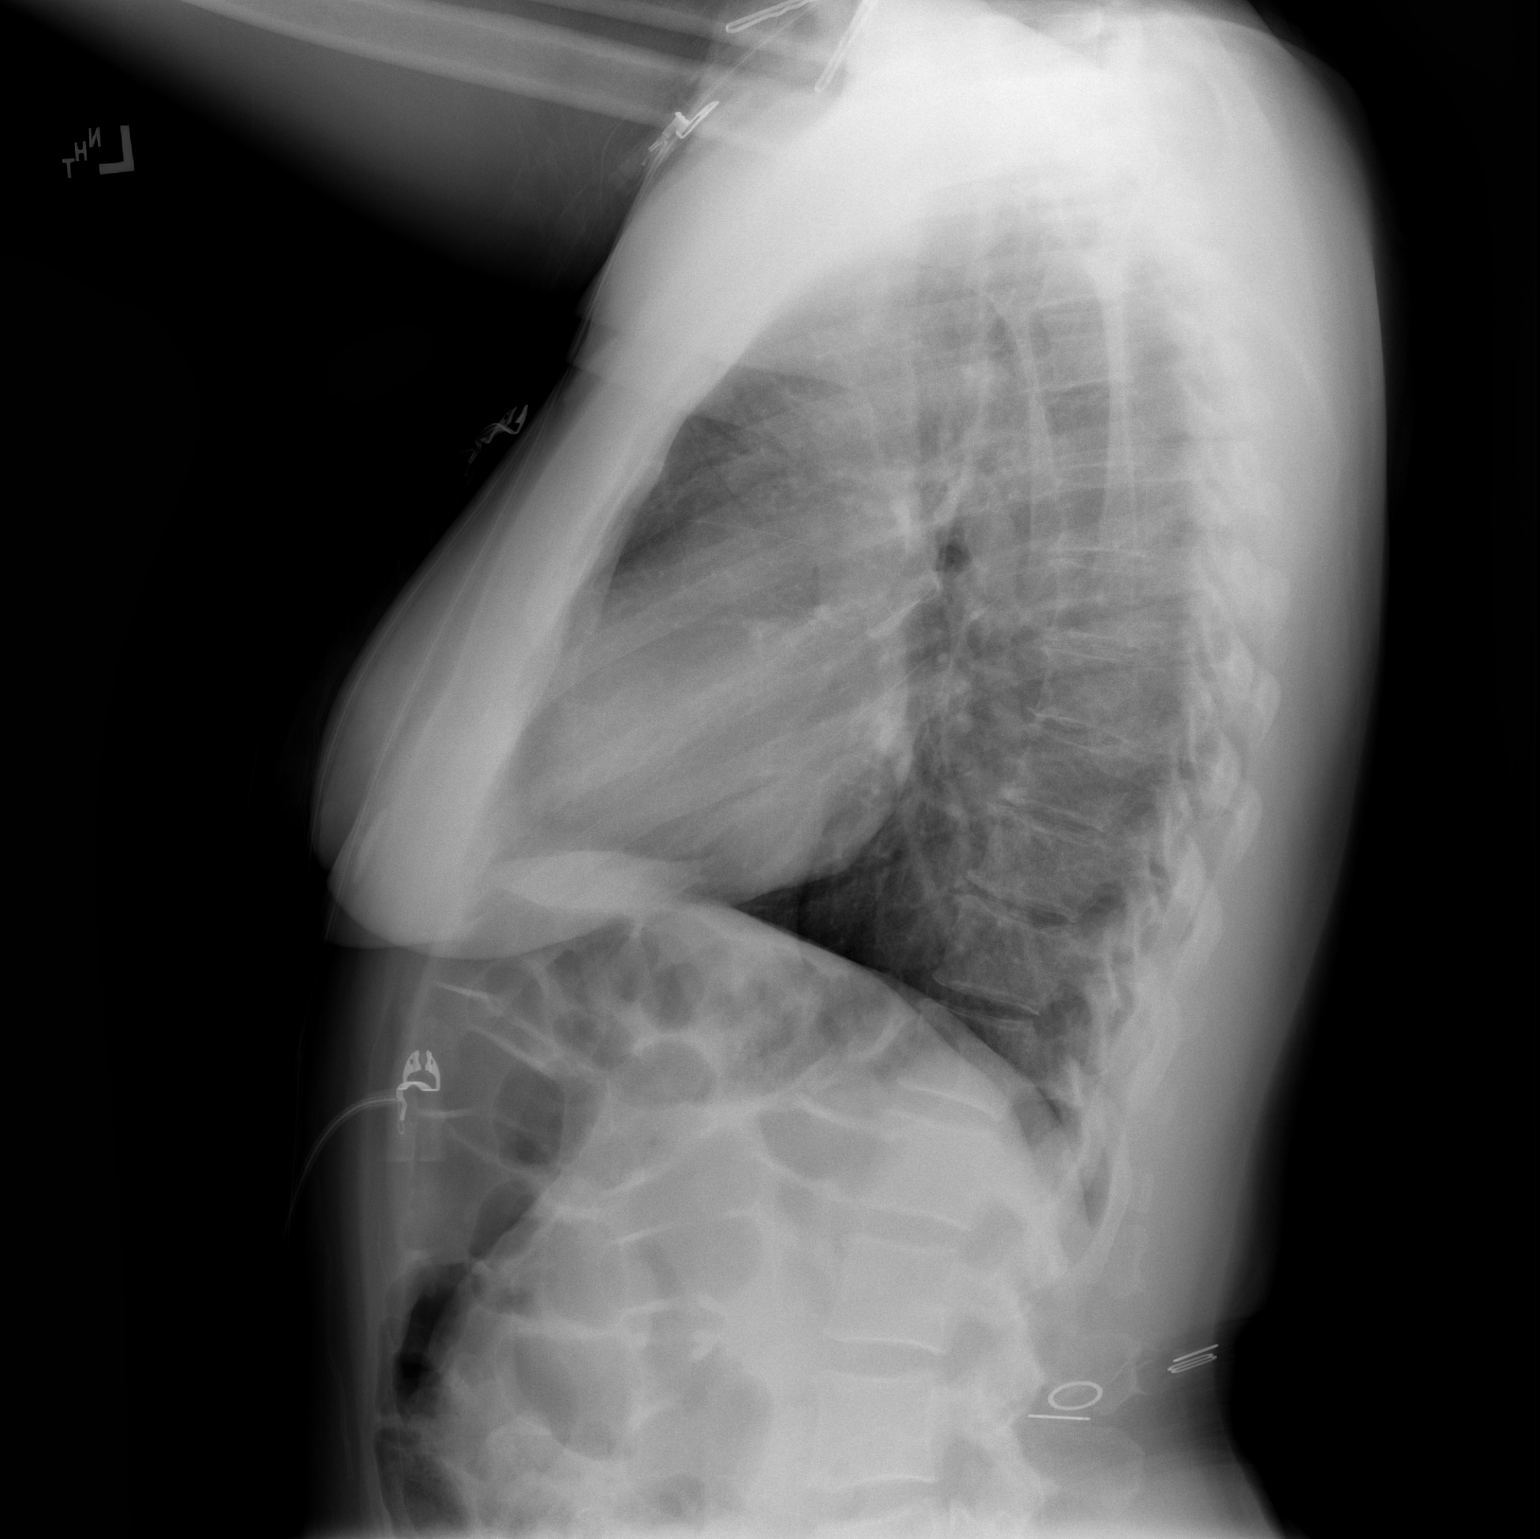

[2 of 2 positions shown; findings below may reference images not displayed]

FINDINGS: The heart size and mediastinal contours are within normal limits.
Both lungs are clear. The visualized skeletal structures are
unremarkable.
IMPRESSION: No active cardiopulmonary disease.

## 2018-06-15 ENCOUNTER — Encounter: Payer: Self-pay | Admitting: Obstetrics and Gynecology

## 2018-12-05 ENCOUNTER — Encounter (HOSPITAL_BASED_OUTPATIENT_CLINIC_OR_DEPARTMENT_OTHER): Payer: Self-pay | Admitting: Emergency Medicine

## 2018-12-05 ENCOUNTER — Other Ambulatory Visit: Payer: Self-pay

## 2018-12-05 ENCOUNTER — Emergency Department (HOSPITAL_BASED_OUTPATIENT_CLINIC_OR_DEPARTMENT_OTHER)
Admission: EM | Admit: 2018-12-05 | Discharge: 2018-12-05 | Discharge status: Against Medical Advice | Payer: Managed Care, Other (non HMO) | Attending: Emergency Medicine | Admitting: Emergency Medicine

## 2018-12-05 DIAGNOSIS — R109 Unspecified abdominal pain: Secondary | ICD-10-CM | POA: Diagnosis present

## 2018-12-05 DIAGNOSIS — Z5329 Procedure and treatment not carried out because of patient's decision for other reasons: Secondary | ICD-10-CM | POA: Diagnosis not present

## 2018-12-05 NOTE — ED Notes (Signed)
When this RN entered room pt stated she had changed mind about being seen. Pt was on the phone with PCP office making an appt to be seen in the office and stated taht she did not want to be seen by the doctor. She was informed that she was triaged and would received a bill r/t checking in and having v/s taken. Pt LWBS by physician after triage. Pts husband was also being seen per pt statement and she states she could not afford for both pts to be seen in the ED.

## 2018-12-05 NOTE — ED Triage Notes (Signed)
Lower abd pain and L lower back pain since Friday. Denies N/V/D

## 2018-12-05 NOTE — ED Provider Notes (Signed)
[facility] EMERGENCY DEPARTMENT Provider Note   CSN: [id number] Arrival date & time: 12/05/18  1058     History   Chief Complaint Chief Complaint  Patient presents with  . Abdominal Pain  . Back Pain    HPI Andrea Bishop is a 58 y.o. female.  HPI  She has complaints of lower abdominal pain. The patient has decided she no longer wanted to be seen in the ER.  She is in the ER with her husband.  She received vital signs and a triage note from nursing. I went and spoke with pt and offered to see her and offered to evaluate her abdominal pain. She declines at this time       Past Medical History:  Diagnosis Date  . Fibroid   . Seasonal allergies     Patient Active Problem List   Diagnosis Date Noted  . Fibroid uterus 10/13/2011    Past Surgical History:  Procedure Laterality Date  . CESAREAN SECTION       OB History    Gravida  3   Para  1   Term  1   Preterm      AB  2   Living  1     SAB      TAB      Ectopic      Multiple      Live Births  1            Home Medications    Prior to Admission medications   Not on File    Family History Family History  Problem Relation Age of Onset  . Diabetes Mother   . Hypertension Mother   . Cancer Maternal Grandmother        CIRRHOSIS OF LIVER    Social History Social History   Tobacco Use  . Smoking status: Never Smoker  . Smokeless tobacco: Never Used  Substance Use Topics  . Alcohol use: No  . Drug use: No     Allergies   Aleve [naproxen]   Review of Systems Review of Systems   Physical Exam Updated Vital Signs BP (!) 159/75 (BP Location: Left Arm)   Pulse 84   Temp 97.9 F (36.6 C) (Oral)   Resp 16   Ht 5' 5.5" (1.664 m)   Wt 65.3 kg   LMP 06/27/2016   SpO2 100%   BMI 23.60 kg/m   Physical Exam Vitals signs and nursing note reviewed.  Constitutional:      Appearance: She is well-developed.  HENT:     Head: Normocephalic.  Neck:   Musculoskeletal: Normal range of motion.  Pulmonary:     Effort: Pulmonary effort is normal.  Abdominal:     General: There is no distension.  Musculoskeletal: Normal range of motion.  Neurological:     Mental Status: She is alert and oriented to person, place, and time.      ED Treatments / Results  Labs (all labs ordered are listed, but only abnormal results are displayed) Labs Reviewed - No data to display  EKG None  Radiology No results found.  Procedures Procedures (including critical care time)  Medications Ordered in ED Medications - No data to display   Initial Impression / Assessment and Plan / ED Course  I have reviewed the triage vital signs and the nursing notes.  Pertinent labs & imaging results that were available during my care of the patient were reviewed by me and considered in my  medical decision making (see chart for details).     Unable to complete a medical screening examination. Pt encouraged to return to the ER for new or worsening symptoms  Final Clinical Impressions(s) / ED Diagnoses   Final diagnoses:  Abdominal pain, unspecified abdominal location    ED Discharge Orders    None       Jola Schmidt, MD 12/05/18 1128

## 2018-12-05 NOTE — ED Provider Notes (Signed)
My decided she no longer wanted to be seen in the ER. Eloped. Unable to evaluate the patient and perform a MSE   Jola Schmidt, MD 12/05/18 1119

## 2019-09-06 ENCOUNTER — Ambulatory Visit: Payer: Self-pay | Admitting: *Deleted

## 2019-09-06 NOTE — Telephone Encounter (Signed)
Pt called with having sharp pains in her chest off and on that started yesterday. They last a few seconds. She denies nausea, sweating, weakness. The pain dose not radiate. She denies fever or cough or cardiac syptoms. She feels it sometimes when she bends down to pick up something.  Per protocol she needs to be seen by her provider for assessment. Advised that if she starts having cardiac symptoms to go to the ED or call 911. She voiced understanding.  Reason for Disposition . [1] Chest pain lasting < 5 minutes AND [2] NO chest pain or cardiac symptoms (e.g., breathing difficulty, sweating) now  Answer Assessment - Initial Assessment Questions 1. LOCATION: "Where does it hurt?"       Chest area 2. RADIATION: "Does the pain go anywhere else?" (e.g., into neck, jaw, arms, back)     no 3. ONSET: "When did the chest pain begin?" (Minutes, hours or days)      Last 2 days 4. PATTERN "Does the pain come and go, or has it been constant since it started?"  "Does it get worse with exertion?"      Comes and goes and worse when bending down or stressful conversation 5. DURATION: "How long does it last" (e.g., seconds, minutes, hours)     seconds 6. SEVERITY: "How bad is the pain?"  (e.g., Scale 1-10; mild, moderate, or severe)    - MILD (1-3): doesn't interfere with normal activities     - MODERATE (4-7): interferes with normal activities or awakens from sleep    - SEVERE (8-10): excruciating pain, unable to do any normal activities       Pain # 8 7. CARDIAC RISK FACTORS: "Do you have any history of heart problems or risk factors for heart disease?" (e.g., angina, prior heart attack; diabetes, high blood pressure, high cholesterol, smoker, or strong family history of heart disease)     Family history, borderline high blood pressure 8. PULMONARY RISK FACTORS: "Do you have any history of lung disease?"  (e.g., blood clots in lung, asthma, emphysema, birth control pills)     no 9. CAUSE: "What do you  think is causing the chest pain?"     Not sure what is causing this pain 10. OTHER SYMPTOMS: "Do you have any other symptoms?" (e.g., dizziness, nausea, vomiting, sweating, fever, difficulty breathing, cough)       no 11. PREGNANCY: "Is there any chance you are pregnant?" "When was your last menstrual period?"       Not having periods  Protocols used: CHEST PAIN-A-AH

## 2019-09-21 ENCOUNTER — Other Ambulatory Visit: Payer: Self-pay

## 2019-09-21 DIAGNOSIS — Z20822 Contact with and (suspected) exposure to covid-19: Secondary | ICD-10-CM

## 2019-09-22 LAB — NOVEL CORONAVIRUS, NAA: SARS-CoV-2, NAA: NOT DETECTED

## 2022-10-12 ENCOUNTER — Telehealth: Payer: Self-pay | Admitting: Family

## 2022-10-12 NOTE — Telephone Encounter (Signed)
Received referral from [facility] for patient to schedule for primary cared. Left message for patient to return call for scheduling.

## 2022-10-25 ENCOUNTER — Other Ambulatory Visit: Payer: Self-pay | Admitting: Obstetrics and Gynecology

## 2022-10-25 DIAGNOSIS — R928 Other abnormal and inconclusive findings on diagnostic imaging of breast: Secondary | ICD-10-CM

## 2022-11-10 ENCOUNTER — Other Ambulatory Visit: Payer: Managed Care, Other (non HMO)
